# Patient Record
Sex: Male | Born: 1970 | Race: White | Hispanic: No | Marital: Married | Smoking: Never smoker
Health system: Southern US, Community
[De-identification: ages and names within clinical notes are randomized; demographics above are authoritative.]

## PROBLEM LIST (undated history)

## (undated) DIAGNOSIS — I10 Essential (primary) hypertension: Secondary | ICD-10-CM

---

## 2002-11-08 ENCOUNTER — Emergency Department (HOSPITAL_COMMUNITY): Admission: EM | Admit: 2002-11-08 | Discharge: 2002-11-08 | Payer: Self-pay | Admitting: Emergency Medicine

## 2002-11-08 ENCOUNTER — Encounter: Payer: Self-pay | Admitting: *Deleted

## 2009-11-05 ENCOUNTER — Inpatient Hospital Stay (HOSPITAL_COMMUNITY): Admission: RE | Admit: 2009-11-05 | Discharge: 2009-11-07 | Payer: Self-pay | Admitting: Cardiovascular Disease

## 2009-11-05 ENCOUNTER — Ambulatory Visit: Payer: Self-pay | Admitting: Cardiology

## 2009-11-05 ENCOUNTER — Encounter: Payer: Self-pay | Admitting: Emergency Medicine

## 2009-11-06 ENCOUNTER — Encounter: Payer: Self-pay | Admitting: Cardiology

## 2009-11-06 ENCOUNTER — Ambulatory Visit: Payer: Self-pay | Admitting: Cardiovascular Disease

## 2009-11-07 ENCOUNTER — Encounter (INDEPENDENT_AMBULATORY_CARE_PROVIDER_SITE_OTHER): Payer: Self-pay | Admitting: *Deleted

## 2009-11-07 LAB — CONVERTED CEMR LAB
Chloride: 101 meq/L
Hemoglobin: 14.7 g/dL
MCV: 92.2 fL
Potassium: 4 meq/L
RBC: 4.49 M/uL
Sodium: 137 meq/L
WBC: 14.1 10*3/uL

## 2009-11-23 ENCOUNTER — Encounter (INDEPENDENT_AMBULATORY_CARE_PROVIDER_SITE_OTHER): Payer: Self-pay | Admitting: *Deleted

## 2009-11-29 ENCOUNTER — Encounter (INDEPENDENT_AMBULATORY_CARE_PROVIDER_SITE_OTHER): Payer: Self-pay | Admitting: *Deleted

## 2010-05-22 NOTE — Miscellaneous (Signed)
Summary: hospital labs 11/07/2009  Clinical Lists Changes  Observations: Added new observation of CALCIUM: 8.9 mg/dL (16/01/9603 5:40) Added new observation of GFR AA: >60 mL/min/1.14m2 (11/07/2009 9:09) Added new observation of GFR: >60 mL/min (11/07/2009 9:09) Added new observation of CREATININE: 1.07 mg/dL (98/02/9146 8:29) Added new observation of BUN: 9 mg/dL (56/21/3086 5:78) Added new observation of BG RANDOM: 138 mg/dL (46/96/2952 8:41) Added new observation of CO2 PLSM/SER: 29 meq/L (11/07/2009 9:09) Added new observation of CL SERUM: 101 meq/L (11/07/2009 9:09) Added new observation of K SERUM: 4.0 meq/L (11/07/2009 9:09) Added new observation of NA: 137 meq/L (11/07/2009 9:09) Added new observation of PLATELETK/UL: 227 K/uL (11/07/2009 9:09) Added new observation of RDW: 12.1 % (11/07/2009 9:09) Added new observation of MCHC RBC: 32.9 g/dL (32/44/0102 7:25) Added new observation of MCV: 92.2 fL (11/07/2009 9:09) Added new observation of HCT: 41.4 % (11/07/2009 9:09) Added new observation of HGB: 14.7 g/dL (36/64/4034 7:42) Added new observation of RBC M/UL: 4.49 M/uL (11/07/2009 9:09) Added new observation of WBC COUNT: 14.1 10*3/microliter (11/07/2009 9:09)

## 2010-05-22 NOTE — Letter (Signed)
Summary: Appointment - Missed  [facility], Main Office  [address]   [place], [place] [address]   Phone: [phone]  Fax: [phone]     November 29, 2009 MRN: [id number]   [facility] [address] [place], [place]  [address]   Dear Mr. Spurgin,  Our records indicate you missed your appointment on   November 29, 2009              with Dr.  Diona Browner.  It is very important that we reach you to reschedule this appointment. We look forward to participating in your health care needs. Please contact us at the number listed above at your earliest convenience to reschedule this appointment.     Sincerely,    Glass blower/designer

## 2010-07-07 LAB — CK TOTAL AND CKMB (NOT AT ARMC)
CK, MB: 25.8 ng/mL (ref 0.3–4.0)
Relative Index: 1.9 (ref 0.0–2.5)
Relative Index: 4.9 — ABNORMAL HIGH (ref 0.0–2.5)
Relative Index: 5.5 — ABNORMAL HIGH (ref 0.0–2.5)
Total CK: 420 U/L — ABNORMAL HIGH (ref 7–232)

## 2010-07-07 LAB — BASIC METABOLIC PANEL
BUN: 10 mg/dL (ref 6–23)
CO2: 24 mEq/L (ref 19–32)
Calcium: 9.1 mg/dL (ref 8.4–10.5)
Calcium: 8.5 mg/dL (ref 8.4–10.5)
Calcium: 8.9 mg/dL (ref 8.4–10.5)
Chloride: 105 mEq/L (ref 96–112)
Creatinine, Ser: 1.2 mg/dL (ref 0.4–1.5)
Creatinine, Ser: 1.03 mg/dL (ref 0.4–1.5)
GFR calc Af Amer: >60 mL/min (ref >60)
GFR calc Af Amer: >60 mL/min (ref >60)
GFR calc Af Amer: >60 mL/min (ref >60)
GFR calc non Af Amer: >60 mL/min (ref >60)
GFR calc non Af Amer: >60 mL/min (ref >60)
GFR calc non Af Amer: >60 mL/min (ref >60)
Potassium: 4 mEq/L (ref 3.5–5.1)
Sodium: 135 mEq/L (ref 135–145)
Sodium: 135 mEq/L (ref 135–145)
Sodium: 137 mEq/L (ref 135–145)

## 2010-07-07 LAB — DIFFERENTIAL
Basophils Absolute: 0 10*3/uL (ref 0.0–0.1)
Eosinophils Absolute: 0 10*3/uL (ref 0.0–0.7)
Eosinophils Relative: 0 % (ref 0–5)
Lymphocytes Relative: 13 % (ref 12–46)
Neutro Abs: 4.8 10*3/uL (ref 1.7–7.7)
Neutrophils Relative %: 77 % (ref 43–77)

## 2010-07-07 LAB — CBC
HCT: 41.4 % (ref 39.0–52.0)
Hemoglobin: 16 g/dL (ref 13.0–17.0)
Hemoglobin: 14.7 g/dL (ref 13.0–17.0)
Hemoglobin: 15.1 g/dL (ref 13.0–17.0)
MCH: 33.3 pg (ref 26.0–34.0)
MCHC: 35.7 g/dL (ref 30.0–36.0)
Platelets: 188 10*3/uL (ref 150–400)
Platelets: 173 10*3/uL (ref 150–400)
RBC: 4.89 MIL/uL (ref 4.22–5.81)
RBC: 4.49 MIL/uL (ref 4.22–5.81)
RBC: 4.55 MIL/uL (ref 4.22–5.81)
WBC: 6.3 10*3/uL (ref 4.0–10.5)
WBC: 14.1 10*3/uL — ABNORMAL HIGH (ref 4.0–10.5)

## 2010-07-07 LAB — RAPID URINE DRUG SCREEN, HOSP PERFORMED
Amphetamines: NOT DETECTED
Barbiturates: NOT DETECTED
Opiates: POSITIVE — AB

## 2010-07-07 LAB — D-DIMER, QUANTITATIVE: D-Dimer, Quant: 0.7 ug/mL-FEU — ABNORMAL HIGH (ref 0.00–0.48)

## 2010-07-07 LAB — POCT CARDIAC MARKERS: Myoglobin, poc: 122 ng/mL (ref 12–200)

## 2010-07-07 LAB — TROPONIN I: Troponin I: 3.03 ng/mL (ref 0.00–0.06)

## 2011-07-04 ENCOUNTER — Other Ambulatory Visit: Payer: Self-pay | Admitting: *Deleted

## 2011-07-04 MED ORDER — METOPROLOL TARTRATE 25 MG PO TABS: 25.0000 mg | ORAL_TABLET | Freq: Two times a day (BID) | ORAL | Status: DC

## 2011-07-04 MED ORDER — METOPROLOL TARTRATE 25 MG PO TABS: 25.0000 mg | ORAL_TABLET | Freq: Two times a day (BID) | ORAL | Status: AC

## 2015-12-06 ENCOUNTER — Telehealth: Payer: Self-pay

## 2015-12-06 NOTE — Telephone Encounter (Signed)
Called patient to offer new patient intake.  Left message on machine

## 2016-03-07 ENCOUNTER — Ambulatory Visit (INDEPENDENT_AMBULATORY_CARE_PROVIDER_SITE_OTHER): Payer: BLUE CROSS/BLUE SHIELD | Admitting: Internal Medicine

## 2016-03-07 ENCOUNTER — Encounter: Payer: Self-pay | Admitting: Internal Medicine

## 2016-03-07 ENCOUNTER — Other Ambulatory Visit (HOSPITAL_COMMUNITY)
Admission: RE | Admit: 2016-03-07 | Discharge: 2016-03-07 | Disposition: A | Discharge status: Home / Self Care | Payer: BLUE CROSS/BLUE SHIELD | Source: Ambulatory Visit | Attending: Internal Medicine | Admitting: Internal Medicine

## 2016-03-07 DIAGNOSIS — Z79899 Other long term (current) drug therapy: Secondary | ICD-10-CM | POA: Insufficient documentation

## 2016-03-07 DIAGNOSIS — B20 Human immunodeficiency virus [HIV] disease: Secondary | ICD-10-CM

## 2016-03-07 DIAGNOSIS — Z113 Encounter for screening for infections with a predominantly sexual mode of transmission: Secondary | ICD-10-CM | POA: Diagnosis not present

## 2016-03-07 DIAGNOSIS — Z21 Asymptomatic human immunodeficiency virus [HIV] infection status: Secondary | ICD-10-CM | POA: Insufficient documentation

## 2016-03-07 LAB — COMPLETE METABOLIC PANEL WITH GFR
ALBUMIN: 4.8 g/dL (ref 3.6–5.1)
ALK PHOS: 75 U/L (ref 40–115)
ALT: 42 U/L (ref 9–46)
AST: 32 U/L (ref 10–40)
BILIRUBIN TOTAL: 0.6 mg/dL (ref 0.2–1.2)
BUN: 19 mg/dL (ref 7–25)
CO2: 26 mmol/L (ref 20–31)
CREATININE: 1.21 mg/dL (ref 0.60–1.35)
Calcium: 9.7 mg/dL (ref 8.6–10.3)
Chloride: 103 mmol/L (ref 98–110)
GFR, EST AFRICAN AMERICAN: 84 mL/min (ref >=60)
GFR, Est Non African American: 72 mL/min (ref >=60)
GLUCOSE: 97 mg/dL (ref 65–99)
Potassium: 4.1 mmol/L (ref 3.5–5.3)
Sodium: 137 mmol/L (ref 135–146)
TOTAL PROTEIN: 8.3 g/dL — AB (ref 6.1–8.1)

## 2016-03-07 LAB — CBC WITH DIFFERENTIAL/PLATELET
BASOS ABS: 78 {cells}/uL (ref 0–200)
Basophils Relative: 1 %
EOS ABS: 156 {cells}/uL (ref 15–500)
EOS PCT: 2 %
HCT: 45.7 % (ref 38.5–50.0)
Hemoglobin: 16 g/dL (ref 13.2–17.1)
LYMPHS PCT: 28 %
Lymphs Abs: 2184 cells/uL (ref 850–3900)
MCH: 32.4 pg (ref 27.0–33.0)
MCHC: 35 g/dL (ref 32.0–36.0)
MCV: 92.5 fL (ref 80.0–100.0)
MONOS PCT: 8 %
MPV: 8.5 fL (ref 7.5–12.5)
Monocytes Absolute: 624 cells/uL (ref 200–950)
Neutro Abs: 4758 cells/uL (ref 1500–7800)
Neutrophils Relative %: 61 %
PLATELETS: 284 10*3/uL (ref 140–400)
RBC: 4.94 MIL/uL (ref 4.20–5.80)
RDW: 13 % (ref 11.0–15.0)
WBC: 7.8 10*3/uL (ref 3.8–10.8)

## 2016-03-07 LAB — LIPID PANEL
Cholesterol: 209 mg/dL — ABNORMAL HIGH (ref <200)
HDL: 30 mg/dL — ABNORMAL LOW (ref >40)
LDL Cholesterol: 126 mg/dL — ABNORMAL HIGH (ref <100)
Total CHOL/HDL Ratio: 7 Ratio — ABNORMAL HIGH (ref <5.0)
Triglycerides: 263 mg/dL — ABNORMAL HIGH (ref <150)
VLDL: 53 mg/dL — ABNORMAL HIGH (ref <30)

## 2016-03-08 LAB — URINALYSIS, ROUTINE W REFLEX MICROSCOPIC
Bilirubin Urine: NEGATIVE
GLUCOSE, UA: NEGATIVE
HGB URINE DIPSTICK: NEGATIVE
Ketones, ur: NEGATIVE
LEUKOCYTES UA: NEGATIVE
Nitrite: NEGATIVE
PH: 6 (ref 5.0–8.0)
Protein, ur: NEGATIVE
Specific Gravity, Urine: 1.026 (ref 1.001–1.035)

## 2016-03-08 LAB — T-HELPER CELL (CD4) - (RCID CLINIC ONLY)
CD4 T CELL HELPER: 31 % — AB (ref 33–55)
CD4 T Cell Abs: 640 /uL (ref 400–2700)

## 2016-03-08 LAB — HEPATITIS B SURFACE ANTIBODY,QUALITATIVE: Hep B S Ab: NEGATIVE

## 2016-03-08 LAB — HIV ANTIBODY (ROUTINE TESTING W REFLEX): HIV 1&2 Ab, 4th Generation: REACTIVE — AB

## 2016-03-08 LAB — URINE CYTOLOGY ANCILLARY ONLY
CHLAMYDIA, DNA PROBE: NEGATIVE
Neisseria Gonorrhea: NEGATIVE

## 2016-03-08 LAB — HEPATITIS B CORE ANTIBODY, TOTAL: HEP B C TOTAL AB: NONREACTIVE

## 2016-03-08 LAB — HEPATITIS B SURFACE ANTIGEN: HEP B S AG: NEGATIVE

## 2016-03-08 LAB — HIV 1/2 CONFIRMATION
HIV-1 antibody: POSITIVE — AB
HIV-2 Ab: NEGATIVE

## 2016-03-08 LAB — HEPATITIS A ANTIBODY, TOTAL: HEP A TOTAL AB: BORDERLINE — AB

## 2016-03-08 LAB — RPR
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
## 2016-03-11 LAB — HIV-1 RNA ULTRAQUANT REFLEX TO GENTYP+
HIV 1 RNA Quant: 731 copies/mL — ABNORMAL HIGH (ref <20)
HIV-1 RNA QUANT, LOG: 2.86 {Log_copies}/mL — AB (ref <1.30)

## 2016-03-11 LAB — QUANTIFERON TB GOLD ASSAY (BLOOD)
Interferon Gamma Release Assay: NEGATIVE
Mitogen-Nil: 7.5 IU/mL
Quantiferon Nil Value: 0.15 IU/mL
Quantiferon Tb Ag Minus Nil Value: 0.04 IU/mL

## 2016-03-12 LAB — HLA B*5701: HLA-B*5701 w/rflx HLA-B High: NEGATIVE

## 2016-03-26 ENCOUNTER — Ambulatory Visit: Payer: BLUE CROSS/BLUE SHIELD | Admitting: Internal Medicine

## 2016-03-26 ENCOUNTER — Encounter: Payer: Self-pay | Admitting: *Deleted

## 2016-06-13 ENCOUNTER — Telehealth: Payer: Self-pay | Admitting: *Deleted

## 2016-06-13 NOTE — Telephone Encounter (Signed)
[facility]. Gerald Schwartz has a detectable viral load per his last blood work (last Nov.)  RN reviewed the chart and noted Gerald Schwartz missed his December visit with Dr Luciana Axeomer and has not called to reschedule another visit.  RN contacted Gerald Schwartz and left a vague message stating my name and that I am a nurse with his Dr's office. RN asked Gerald Schwartz to please return my call because I would like to be sure he has access to his medications.

## 2017-03-03 ENCOUNTER — Telehealth: Payer: Self-pay | Admitting: *Deleted

## 2017-03-03 NOTE — Telephone Encounter (Signed)
Referral received during Viral load suppression meeting. Order is for RN to begin attempts to engage with the patient and offer services. Focus should be on addressing the patient's barrier to care and medication adherence.  Contacted Mr Gerald Schwartz and he sounded well over the phone. Informed him of my name and that we would love to see him at the clinic. Mr Gerald Schwartz stated he is doing well and declined an appt with Dr Luciana Axeomer at this time. I thanked him for accepting my call at this time.

## 2018-12-06 ENCOUNTER — Encounter (HOSPITAL_COMMUNITY): Payer: Self-pay | Admitting: Emergency Medicine

## 2018-12-06 ENCOUNTER — Other Ambulatory Visit: Payer: Self-pay

## 2018-12-06 ENCOUNTER — Emergency Department (HOSPITAL_COMMUNITY)
Admission: EM | Admit: 2018-12-06 | Discharge: 2018-12-06 | Disposition: A | Discharge status: Home / Self Care | Payer: BLUE CROSS/BLUE SHIELD | Attending: Emergency Medicine | Admitting: Emergency Medicine

## 2018-12-06 DIAGNOSIS — I10 Essential (primary) hypertension: Secondary | ICD-10-CM | POA: Insufficient documentation

## 2018-12-06 DIAGNOSIS — F419 Anxiety disorder, unspecified: Secondary | ICD-10-CM | POA: Insufficient documentation

## 2018-12-06 HISTORY — DX: Essential (primary) hypertension: I10

## 2018-12-06 LAB — BASIC METABOLIC PANEL
Anion gap: 13 (ref 5–15)
BUN: 24 mg/dL — ABNORMAL HIGH (ref 6–20)
CO2: 22 mmol/L (ref 22–32)
Calcium: 9.6 mg/dL (ref 8.9–10.3)
Chloride: 103 mmol/L (ref 98–111)
Creatinine, Ser: 1.73 mg/dL — ABNORMAL HIGH (ref 0.61–1.24)
GFR calc Af Amer: 53 mL/min — ABNORMAL LOW (ref >60)
GFR calc non Af Amer: 46 mL/min — ABNORMAL LOW (ref >60)
Glucose, Bld: 113 mg/dL — ABNORMAL HIGH (ref 70–99)
Potassium: 3.4 mmol/L — ABNORMAL LOW (ref 3.5–5.1)
Sodium: 138 mmol/L (ref 135–145)

## 2018-12-06 LAB — CBC
HCT: 45.4 % (ref 39.0–52.0)
Hemoglobin: 16.6 g/dL (ref 13.0–17.0)
MCH: 32.8 pg (ref 26.0–34.0)
MCHC: 36.6 g/dL — ABNORMAL HIGH (ref 30.0–36.0)
MCV: 89.7 fL (ref 80.0–100.0)
Platelets: 255 10*3/uL (ref 150–400)
RBC: 5.06 MIL/uL (ref 4.22–5.81)
RDW: 14.6 % (ref 11.5–15.5)
WBC: 9.7 10*3/uL (ref 4.0–10.5)
nRBC: 0.2 % (ref 0.0–0.2)

## 2018-12-06 MED ORDER — HYDROCHLOROTHIAZIDE 25 MG PO TABS: 25.0000 mg | ORAL_TABLET | Freq: Every day | ORAL | 2 refills | Status: AC

## 2018-12-06 MED ORDER — LORAZEPAM 1 MG PO TABS
1.0000 mg | ORAL_TABLET | Freq: Once | ORAL | Status: AC
  Administered 2018-12-06: 08:00:00 1 mg via ORAL
  Filled 2018-12-06: qty 1

## 2018-12-06 MED ORDER — CLONIDINE HCL 0.2 MG PO TABS
0.2000 mg | ORAL_TABLET | Freq: Once | ORAL | Status: AC
  Administered 2018-12-06: 08:00:00 0.2 mg via ORAL
  Filled 2018-12-06: qty 1

## 2018-12-06 MED ORDER — AMLODIPINE BESYLATE 5 MG PO TABS: 5.0000 mg | ORAL_TABLET | Freq: Every day | ORAL | 2 refills | Status: AC

## 2018-12-06 NOTE — Discharge Instructions (Signed)
Start the 2 blood pressure medicines that is been prescribed.  Amlodipine is the more significant 1.  Make an appointment to follow-up with [place].  Return for any new or worse symptoms.

## 2018-12-06 NOTE — ED Triage Notes (Signed)
Pt states he was diagnosed with hypertension 73yrs ago but has run out of meds. States he has been off his meds for 39yrs and is here for meds. BP @ home was 224/157.

## 2018-12-06 NOTE — ED Provider Notes (Signed)
[facility] EMERGENCY DEPARTMENT Provider Note   CSN: [id number] Arrival date & time: 12/06/18  [id number]     History   Chief Complaint Chief Complaint  Patient presents with  . Hypertension    HPI Gerald Schwartz is a 48 y.o. male.     Patient stated he was treated with high blood pressure medicines in the past cannot remember what they were.  [facility].  He was not very compliant on them.  Now he is concerned about his pressures being high he has been checking it at home.  No real symptoms.  He does have a history of posttraumatic stress disorder.  He is feeling anxious.  Denies any shortness of breath or chest pain.  Oxygen sats are in the upper 90s.  State he had some epigastric discomfort last night but that was after taking some BC powders.     Past Medical History:  Diagnosis Date  . Hypertension     Patient Active Problem List   Diagnosis Date Noted  . 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 Screening examination for venereal disease 03/07/2016  . Encounter for long-term (current) use of high-risk medication 03/07/2016    History reviewed. No pertinent surgical history.      Home Medications    Prior to Admission medications   Medication Sig Start Date End Date Taking? Authorizing Provider  amLODipine (NORVASC) 5 MG tablet Take 1 tablet (5 mg total) by mouth daily. 12/06/18   Vanetta MuldersZackowski, Kristol Almanzar, MD  hydrochlorothiazide (HYDRODIURIL) 25 MG tablet Take 1 tablet (25 mg total) by mouth daily. 12/06/18   Vanetta MuldersZackowski, Ellean Firman, MD    Family History No family history on file.  Social History Social History   Tobacco Use  . Smoking status: Never Smoker  . Smokeless tobacco: Never Used  Substance Use Topics  . Alcohol use: Yes    Comment: occasional  . Drug use: No     Allergies   Patient has no known allergies.   Review of Systems Review of Systems  Constitutional: Negative for chills and fever.  HENT: Negative  for congestion, rhinorrhea and sore throat.   Eyes: Negative for visual disturbance.  Respiratory: Negative for cough and shortness of breath.   Cardiovascular: Negative for chest pain and leg swelling.  Gastrointestinal: Negative for abdominal pain, diarrhea, nausea and vomiting.  Genitourinary: Negative for dysuria.  Musculoskeletal: Negative for back pain and neck pain.  Skin: Negative for rash.  Neurological: Negative for dizziness, light-headedness and headaches.  Hematological: Does not bruise/bleed easily.  Psychiatric/Behavioral: Negative for confusion. The patient is nervous/anxious.      Physical Exam Updated Vital Signs BP (!) 167/136   Pulse 93   Temp 97.6 F (36.4 C) (Oral)   Resp (!) 28   Ht 1.676 m (5\' 6" )   Wt 93 kg   SpO2 93%   BMI 33.09 kg/m   Physical Exam Vitals signs and nursing note reviewed.  Constitutional:      Appearance: He is well-developed.  HENT:     Head: Normocephalic and atraumatic.  Eyes:     Conjunctiva/sclera: Conjunctivae normal.  Neck:     Musculoskeletal: Neck supple.  Cardiovascular:     Rate and Rhythm: Normal rate and regular rhythm.     Heart sounds: No murmur.  Pulmonary:     Effort: Pulmonary effort is normal. No respiratory distress.     Breath sounds: Normal breath sounds.  Abdominal:     Palpations:  Abdomen is soft.     Tenderness: There is no abdominal tenderness.  Musculoskeletal:        General: No swelling.  Skin:    General: Skin is warm and dry.  Neurological:     General: No focal deficit present.     Mental Status: He is alert and oriented to person, place, and time.      ED Treatments / Results  Labs (all labs ordered are listed, but only abnormal results are displayed) Labs Reviewed  BASIC METABOLIC PANEL - Abnormal; Notable for the following components:      Result Value   Potassium 3.4 (*)    Glucose, Bld 113 (*)    BUN 24 (*)    Creatinine, Ser 1.73 (*)    GFR calc non Af Amer 46 (*)     GFR calc Af Amer 53 (*)    All other components within normal limits  CBC - Abnormal; Notable for the following components:   MCHC 36.6 (*)    All other components within normal limits    EKG EKG Interpretation  Date/Time:  Sunday December 06 2018 06:50:46 EDT Ventricular Rate:  108 PR Interval:    QRS Duration: 104 QT Interval:  377 QTC Calculation: 506 R Axis:   -56 Text Interpretation:  Sinus tachycardia Probable left atrial enlargement Left anterior fascicular block Abnormal R-wave progression, late transition Nonspecific T abnormalities, lateral leads Prolonged QT interval Rate faster Nonspecific ST and T wave abnormality Confirmed by Rancour, Stephen ([id number]) on 12/06/2018 7:11:42 AM   Radiology No results found.  Procedures Procedures (including critical care time)  Medications Ordered in ED Medications  LORazepam (ATIVAN) tablet 1 mg (1 mg Oral Given 12/06/18 0822)  cloNIDine (CATAPRES) tablet 0.2 mg (0.2 mg Oral Given 12/06/18 0822)     Initial Impression / Assessment and Plan / ED Course  I have reviewed the triage vital signs and the nursing notes.  Pertinent labs & imaging results that were available during my care of the patient were reviewed by me and considered in my medical decision making (see chart for details).         Patient did appear a bit agitated.  Given some Ativan did not make much difference.  Also given some clonidine blood pressure did not reduce significantly.  Patient without any endorgan abnormalities.  Blood pressures probably been elevated long-term.  Patient did talk about having some epigastric discomfort after taking some BC powders last night.  That did occur this morning again.  Discussed work-up for chest pain concern.  But patient refused.  EKG without any acute changes.  Patient aware of the risk that the chest pain could be something significant.  Patient with uncontrolled hypertension.  Is been off medications for a long period of  time.  We will start him on hydrochlorothiazide and amlodipine.  Patient will follow back up with [facility] for adjustment of these medications.  Final Clinical Impressions(s) / ED Diagnoses   Final diagnoses:  Essential hypertension    ED Discharge Orders         Ordered    hydrochlorothiazide (HYDRODIURIL) 25 MG tablet  Daily     12/06/18 1014    amLODipine (NORVASC) 5 MG tablet  Daily     08 /16/20 1023           Fredia Sorrow, MD 12/06/18 1029

## 2019-12-25 ENCOUNTER — Other Ambulatory Visit: Payer: Self-pay

## 2019-12-25 ENCOUNTER — Encounter (HOSPITAL_COMMUNITY): Payer: Self-pay | Admitting: *Deleted

## 2019-12-25 ENCOUNTER — Emergency Department (HOSPITAL_COMMUNITY)
Admission: EM | Admit: 2019-12-25 | Discharge: 2019-12-25 | Disposition: A | Discharge status: Home / Self Care | Payer: BC Managed Care – PPO | Attending: Emergency Medicine | Admitting: Emergency Medicine

## 2019-12-25 ENCOUNTER — Emergency Department (HOSPITAL_COMMUNITY): Payer: BC Managed Care – PPO

## 2019-12-25 DIAGNOSIS — X58XXXA Exposure to other specified factors, initial encounter: Secondary | ICD-10-CM | POA: Diagnosis not present

## 2019-12-25 DIAGNOSIS — Y999 Unspecified external cause status: Secondary | ICD-10-CM | POA: Insufficient documentation

## 2019-12-25 DIAGNOSIS — Z23 Encounter for immunization: Secondary | ICD-10-CM | POA: Diagnosis not present

## 2019-12-25 DIAGNOSIS — Y9289 Other specified places as the place of occurrence of the external cause: Secondary | ICD-10-CM | POA: Diagnosis not present

## 2019-12-25 DIAGNOSIS — Y9389 Activity, other specified: Secondary | ICD-10-CM | POA: Diagnosis not present

## 2019-12-25 DIAGNOSIS — S79921A Unspecified injury of right thigh, initial encounter: Secondary | ICD-10-CM | POA: Diagnosis present

## 2019-12-25 DIAGNOSIS — T148XXA Other injury of unspecified body region, initial encounter: Secondary | ICD-10-CM | POA: Diagnosis not present

## 2019-12-25 LAB — CBC WITH DIFFERENTIAL/PLATELET
Abs Immature Granulocytes: 0.16 10*3/uL — ABNORMAL HIGH (ref 0.00–0.07)
Basophils Absolute: 0.1 10*3/uL (ref 0.0–0.1)
Basophils Relative: 1 %
Eosinophils Absolute: 0.2 10*3/uL (ref 0.0–0.5)
Eosinophils Relative: 2 %
HCT: 45.9 % (ref 39.0–52.0)
Hemoglobin: 16.5 g/dL (ref 13.0–17.0)
Immature Granulocytes: 1 %
Lymphocytes Relative: 19 %
Lymphs Abs: 2.2 10*3/uL (ref 0.7–4.0)
MCH: 31.9 pg (ref 26.0–34.0)
MCHC: 35.9 g/dL (ref 30.0–36.0)
MCV: 88.6 fL (ref 80.0–100.0)
Monocytes Absolute: 0.7 10*3/uL (ref 0.1–1.0)
Monocytes Relative: 6 %
Neutro Abs: 8.6 10*3/uL — ABNORMAL HIGH (ref 1.7–7.7)
Neutrophils Relative %: 71 %
Platelets: 377 10*3/uL (ref 150–400)
RBC: 5.18 MIL/uL (ref 4.22–5.81)
RDW: 13.2 % (ref 11.5–15.5)
WBC: 12 10*3/uL — ABNORMAL HIGH (ref 4.0–10.5)
nRBC: 0 % (ref 0.0–0.2)

## 2019-12-25 LAB — COMPREHENSIVE METABOLIC PANEL
ALT: 53 U/L — ABNORMAL HIGH (ref 0–44)
AST: 41 U/L (ref 15–41)
Albumin: 4.4 g/dL (ref 3.5–5.0)
Alkaline Phosphatase: 77 U/L (ref 38–126)
Anion gap: 11 (ref 5–15)
BUN: 17 mg/dL (ref 6–20)
CO2: 22 mmol/L (ref 22–32)
Calcium: 9.5 mg/dL (ref 8.9–10.3)
Chloride: 101 mmol/L (ref 98–111)
Creatinine, Ser: 1.5 mg/dL — ABNORMAL HIGH (ref 0.61–1.24)
GFR calc Af Amer: >60 mL/min (ref >60)
GFR calc non Af Amer: 54 mL/min — ABNORMAL LOW (ref >60)
Glucose, Bld: 224 mg/dL — ABNORMAL HIGH (ref 70–99)
Potassium: 3.9 mmol/L (ref 3.5–5.1)
Sodium: 134 mmol/L — ABNORMAL LOW (ref 135–145)
Total Bilirubin: 0.6 mg/dL (ref 0.3–1.2)
Total Protein: 8.4 g/dL — ABNORMAL HIGH (ref 6.5–8.1)

## 2019-12-25 MED ORDER — SODIUM CHLORIDE 0.9 % IV BOLUS
1000.0000 mL | Freq: Once | INTRAVENOUS | Status: AC
  Administered 2019-12-25: 1000 mL via INTRAVENOUS

## 2019-12-25 MED ORDER — CEPHALEXIN 500 MG PO CAPS: 500.0000 mg | ORAL_CAPSULE | Freq: Three times a day (TID) | ORAL | 0 refills | Status: AC

## 2019-12-25 MED ORDER — TETANUS-DIPHTH-ACELL PERTUSSIS 5-2.5-18.5 LF-MCG/0.5 IM SUSP
0.5000 mL | Freq: Once | INTRAMUSCULAR | Status: AC
  Administered 2019-12-25: 0.5 mL via INTRAMUSCULAR
  Filled 2019-12-25: qty 0.5

## 2019-12-25 MED ORDER — HYDROCODONE-ACETAMINOPHEN 5-325 MG PO TABS: 1.0000 | ORAL_TABLET | ORAL | 0 refills | Status: DC | PRN

## 2019-12-25 MED ORDER — IOHEXOL 350 MG/ML SOLN
100.0000 mL | Freq: Once | INTRAVENOUS | Status: AC | PRN
  Administered 2019-12-25: 100 mL via INTRAVENOUS

## 2019-12-25 NOTE — Discharge Instructions (Addendum)
You were evaluated in the Emergency Department and after careful evaluation, we did not find any emergent condition requiring admission or further testing in the hospital.  Your exam/testing today was overall reassuring.  Keep on the pressure dressing and use the knee immobilizer and crutches as often as you can until you can follow-up with Dr. Henreitta Leber.  Call the office number to schedule an appointment for early next week.  Please return to the Emergency Department if you experience any worsening of your condition.  Thank you for allowing [place] to be a part of your care.
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
## 2019-12-30 ENCOUNTER — Ambulatory Visit (INDEPENDENT_AMBULATORY_CARE_PROVIDER_SITE_OTHER): Payer: BC Managed Care – PPO | Admitting: General Surgery

## 2019-12-30 ENCOUNTER — Other Ambulatory Visit: Payer: Self-pay

## 2019-12-30 ENCOUNTER — Encounter: Payer: Self-pay | Admitting: General Surgery

## 2019-12-30 VITALS — BP 154/98 | HR 98 | Temp 98.1°F | Resp 18 | Ht 68.5 in | Wt 204.0 lb

## 2019-12-30 DIAGNOSIS — S71131D Puncture wound without foreign body, right thigh, subsequent encounter: Secondary | ICD-10-CM

## 2019-12-30 DIAGNOSIS — S71131A Puncture wound without foreign body, right thigh, initial encounter: Secondary | ICD-10-CM | POA: Insufficient documentation

## 2019-12-30 MED ORDER — HYDROCODONE-ACETAMINOPHEN 5-325 MG PO TABS: 1.0000 | ORAL_TABLET | ORAL | 0 refills | Status: DC | PRN
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
## 2019-12-30 NOTE — Patient Instructions (Signed)
Let wound drain. Cover with telfa +/- pad daily Keep elevated as able Start mobilizing.  Take ibuprofen for pain medication and norco for breakthrough medication

## 2020-01-06 ENCOUNTER — Ambulatory Visit (INDEPENDENT_AMBULATORY_CARE_PROVIDER_SITE_OTHER): Payer: BC Managed Care – PPO | Admitting: General Surgery

## 2020-01-06 ENCOUNTER — Other Ambulatory Visit: Payer: Self-pay

## 2020-01-06 ENCOUNTER — Encounter: Payer: Self-pay | Admitting: General Surgery

## 2020-01-06 VITALS — BP 108/74 | HR 85 | Temp 98.0°F | Resp 18 | Ht 68.5 in | Wt 198.0 lb

## 2020-01-06 DIAGNOSIS — S71131D Puncture wound without foreign body, right thigh, subsequent encounter: Secondary | ICD-10-CM

## 2020-01-06 NOTE — Patient Instructions (Signed)
Continue with dressing daily.  Expect drainage. Keep moving. Will see back 01/18/2020. Plan to return to work 01/24/2020 pending no changes.
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
## 2020-01-07 ENCOUNTER — Telehealth: Payer: Self-pay | Admitting: Family Medicine

## 2020-01-07 NOTE — Telephone Encounter (Signed)
FMLA paperwork filled out and faxed to Allyson Sabal (per pt) at [phone] with confirmation.

## 2020-01-18 ENCOUNTER — Encounter: Payer: Self-pay | Admitting: General Surgery

## 2020-01-18 ENCOUNTER — Ambulatory Visit (INDEPENDENT_AMBULATORY_CARE_PROVIDER_SITE_OTHER): Payer: BC Managed Care – PPO | Admitting: General Surgery

## 2020-01-18 ENCOUNTER — Other Ambulatory Visit: Payer: Self-pay

## 2020-01-18 VITALS — BP 151/101 | HR 80 | Temp 98.2°F | Resp 16 | Ht 68.5 in | Wt 201.0 lb

## 2020-01-18 DIAGNOSIS — S71131D Puncture wound without foreign body, right thigh, subsequent encounter: Secondary | ICD-10-CM

## 2020-01-18 NOTE — Patient Instructions (Signed)
Continue to ambulate and move leg. Swelling will continue to improve and hematoma will continue to go down. Will see back and plan for return to work 10/18 without restrictions.
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
## 2020-01-26 ENCOUNTER — Encounter: Payer: Self-pay | Admitting: Family Medicine

## 2020-02-03 ENCOUNTER — Ambulatory Visit (INDEPENDENT_AMBULATORY_CARE_PROVIDER_SITE_OTHER): Payer: BC Managed Care – PPO | Admitting: General Surgery

## 2020-02-03 ENCOUNTER — Other Ambulatory Visit: Payer: Self-pay

## 2020-02-03 ENCOUNTER — Other Ambulatory Visit: Payer: Self-pay | Admitting: Family Medicine

## 2020-02-03 ENCOUNTER — Encounter: Payer: Self-pay | Admitting: General Surgery

## 2020-02-03 VITALS — BP 148/98 | HR 77 | Temp 98.6°F | Resp 16 | Ht 68.5 in | Wt 206.0 lb

## 2020-02-03 DIAGNOSIS — S71131D Puncture wound without foreign body, right thigh, subsequent encounter: Secondary | ICD-10-CM | POA: Diagnosis not present

## 2020-02-03 NOTE — Patient Instructions (Signed)
Return to work without restrictions on 02/07/2020.
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
## 2020-02-16 ENCOUNTER — Telehealth: Payer: Self-pay | Admitting: Family Medicine

## 2020-02-16 NOTE — Telephone Encounter (Signed)
Received confirmation of successful transmitting.

## 2020-02-16 NOTE — Telephone Encounter (Signed)
Paperwork filled out and faxed to [facility] at [phone]. Awaiting confirmation.
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
## 2024-02-26 DIAGNOSIS — F3131 Bipolar disorder, current episode depressed, mild: Secondary | ICD-10-CM | POA: Diagnosis not present

## 2024-02-26 DIAGNOSIS — I2510 Atherosclerotic heart disease of native coronary artery without angina pectoris: Secondary | ICD-10-CM | POA: Diagnosis not present

## 2024-02-26 DIAGNOSIS — N1831 Chronic kidney disease, stage 3a: Secondary | ICD-10-CM | POA: Diagnosis not present

## 2024-02-26 DIAGNOSIS — Z6830 Body mass index (BMI) 30.0-30.9, adult: Secondary | ICD-10-CM | POA: Diagnosis not present

## 2024-02-26 DIAGNOSIS — I161 Hypertensive emergency: Secondary | ICD-10-CM | POA: Diagnosis not present

## 2024-02-26 DIAGNOSIS — F411 Generalized anxiety disorder: Secondary | ICD-10-CM | POA: Diagnosis not present

## 2024-02-26 DIAGNOSIS — N521 Erectile dysfunction due to diseases classified elsewhere: Secondary | ICD-10-CM | POA: Diagnosis not present

## 2024-02-26 DIAGNOSIS — E785 Hyperlipidemia, unspecified: Secondary | ICD-10-CM | POA: Diagnosis not present
